# Patient Record
Sex: Male | Born: 1975 | Race: Black or African American | Hispanic: No | Marital: Single | Smoking: Never smoker
Health system: Southern US, Community
[De-identification: ages and names within clinical notes are randomized; demographics above are authoritative.]

## PROBLEM LIST (undated history)

## (undated) HISTORY — PX: VASECTOMY: SHX75

## (undated) HISTORY — PX: OTHER SURGICAL HISTORY: SHX169

---

## 2012-09-26 ENCOUNTER — Ambulatory Visit (INDEPENDENT_AMBULATORY_CARE_PROVIDER_SITE_OTHER): Payer: BC Managed Care – PPO | Admitting: Family Medicine

## 2012-09-26 ENCOUNTER — Encounter: Payer: Self-pay | Admitting: Family Medicine

## 2012-09-26 VITALS — BP 131/80 | HR 76 | Temp 97.7°F | Ht 74.0 in | Wt 196.0 lb

## 2012-09-26 DIAGNOSIS — Z0000 Encounter for general adult medical examination without abnormal findings: Secondary | ICD-10-CM

## 2012-09-26 NOTE — Patient Instructions (Signed)
Testicular Problems and Self-Exam   Men can examine themselves easily and effectively with positive results. Monthly exams detect problems early and save lives. There are numerous causes of swelling in the testicle. Testicular cancer usually appears as a firm painless lump in the front part of the testicle. This may feel like a dull ache or heavy feeling located in the lower abdomen (belly), groin, or scrotum.   The risk is greater in men with undescended testicles and it is more common in young men. It is responsible for almost a fifth of cancers in males between ages 15 and 34. Other common causes of swellings, lumps, and testicular pain include injuries, inflammation (soreness) from infection, hydrocele, and torsion. These are a few of the reasons to do monthly self-examination of the testicles. The exam only takes minutes and could add years to your life. Get in the habit!   SELF-EXAMINATION OF THE TESTICLES   The testicles are easiest to examine after warm baths or showers and are more difficult to examine when you are cold. This is because the muscles attached to the testicles retract and pull them up higher or into the abdomen. While standing, roll one testicle between the thumb and forefinger. Feel for lumps, swelling, or discomfort. A normal testicle is egg shaped and feels firm. It is smooth and not tender. The spermatic cord can be felt as a firm spaghetti-like cord at the back of the testicle. It is also important to examine your groins. This is the crease between the front of your leg and your abdomen. Also, feel for enlarged lymph nodes (glands). Enlarged nodes are also a cause for you to see your caregiver for evaluation.   Self-examination of the testicles and groin areas on a regular basis will help you to know what your own testicles and groins feel like. This will help you pick up an abnormality (difference) at an earlier stage. Early discovery is the key to curing this cancer or treating other  conditions. Any lump, change, or swelling in the testicle calls for immediate evaluation by your caregiver. Cancer of the testicle does not result in impotence and it does not prevent normal intercourse or prevent having children. If your caregiver feels that medical treatment or chemotherapy could lead to infertility, sperm can be frozen for future use. It is necessary to see a caregiver as soon as possible after the discovery of a lump in a testicle.   Document Released: 05/29/2000 Document Revised: 05/15/2011 Document Reviewed: 02/22/2008   Exit[employer]® Patient Information ©2014 [employer].

## 2012-09-26 NOTE — Progress Notes (Signed)
  Subjective:    Patient ID: Bobby Contreras, male    DOB: [birth year], 37 y.o.   MRN: [id number]  HPI This 37 y.o. male presents for evaluation of wellness physical.  He denies any  Acute medical problems.  He has had labs drawn at his work.   Review of Systems No chest pain, SOB, HA, dizziness, vision change, N/V, diarrhea, constipation, dysuria, urinary urgency or frequency, myalgias, arthralgias or rash.     Objective:   Physical Exam Vital signs noted  Well developed well nourished male.  HEENT - Head atraumatic Normocephalic                Eyes - PERRLA, Conjuctiva - clear Sclera- Clear EOMI                Ears - EAC's Wnl TM's Wnl Gross Hearing WNL                Nose - Nares patent                 Throat - oropharanx wnl Respiratory - Lungs CTA bilateral Cardiac - RRR S1 and S2 without murmur GI - Abdomen soft Nontender and bowel sounds active x 4 GU - Normal testicular exam and male phalus, circumcised, No inguinal hernia. Extremities - No edema. Neuro - Grossly intact.        Assessment & Plan:  Routine general medical examination at a health care facility Declined labs since he has had them at work and encouraged him to drop off copy so I can review and have for his Chart.  Follow up in one year and encouraged him to do monthly testicular exams.

## 2013-09-23 ENCOUNTER — Telehealth: Payer: Self-pay | Admitting: Family Medicine

## 2013-12-01 NOTE — Telephone Encounter (Signed)
Wellness physical was not scheduled through his employer.

## 2015-10-01 DIAGNOSIS — Z13220 Encounter for screening for lipoid disorders: Secondary | ICD-10-CM | POA: Diagnosis not present

## 2015-10-01 DIAGNOSIS — Z131 Encounter for screening for diabetes mellitus: Secondary | ICD-10-CM | POA: Diagnosis not present

## 2015-10-01 DIAGNOSIS — Z0000 Encounter for general adult medical examination without abnormal findings: Secondary | ICD-10-CM | POA: Diagnosis not present

## 2016-07-01 ENCOUNTER — Emergency Department (HOSPITAL_COMMUNITY)
Admission: EM | Admit: 2016-07-01 | Discharge: 2016-07-01 | Disposition: A | Discharge status: Home / Self Care | Payer: BLUE CROSS/BLUE SHIELD | Attending: Emergency Medicine | Admitting: Emergency Medicine

## 2016-07-01 ENCOUNTER — Emergency Department (HOSPITAL_COMMUNITY): Payer: BLUE CROSS/BLUE SHIELD

## 2016-07-01 ENCOUNTER — Encounter (HOSPITAL_COMMUNITY): Payer: Self-pay | Admitting: Emergency Medicine

## 2016-07-01 DIAGNOSIS — M7989 Other specified soft tissue disorders: Secondary | ICD-10-CM | POA: Insufficient documentation

## 2016-07-01 DIAGNOSIS — Y999 Unspecified external cause status: Secondary | ICD-10-CM | POA: Insufficient documentation

## 2016-07-01 DIAGNOSIS — M25522 Pain in left elbow: Secondary | ICD-10-CM | POA: Diagnosis not present

## 2016-07-01 DIAGNOSIS — Y9355 Activity, bike riding: Secondary | ICD-10-CM | POA: Insufficient documentation

## 2016-07-01 DIAGNOSIS — S79912A Unspecified injury of left hip, initial encounter: Secondary | ICD-10-CM | POA: Diagnosis not present

## 2016-07-01 DIAGNOSIS — Y92410 Unspecified street and highway as the place of occurrence of the external cause: Secondary | ICD-10-CM | POA: Insufficient documentation

## 2016-07-01 DIAGNOSIS — M25552 Pain in left hip: Secondary | ICD-10-CM | POA: Diagnosis not present

## 2016-07-01 DIAGNOSIS — S59902A Unspecified injury of left elbow, initial encounter: Secondary | ICD-10-CM | POA: Diagnosis present

## 2016-07-01 DIAGNOSIS — S99922A Unspecified injury of left foot, initial encounter: Secondary | ICD-10-CM | POA: Diagnosis not present

## 2016-07-01 DIAGNOSIS — S32432A Displaced fracture of anterior column [iliopubic] of left acetabulum, initial encounter for closed fracture: Secondary | ICD-10-CM | POA: Diagnosis not present

## 2016-07-01 DIAGNOSIS — S51012A Laceration without foreign body of left elbow, initial encounter: Secondary | ICD-10-CM | POA: Diagnosis not present

## 2016-07-01 DIAGNOSIS — S41112A Laceration without foreign body of left upper arm, initial encounter: Secondary | ICD-10-CM | POA: Diagnosis not present

## 2016-07-01 LAB — I-STAT CHEM 8, ED
BUN: 16 mg/dL (ref 6–20)
CALCIUM ION: 1.14 mmol/L — AB (ref 1.15–1.40)
CHLORIDE: 107 mmol/L (ref 101–111)
Creatinine, Ser: 1.2 mg/dL (ref 0.61–1.24)
GLUCOSE: 125 mg/dL — AB (ref 65–99)
HCT: 48 % (ref 39.0–52.0)
Hemoglobin: 16.3 g/dL (ref 13.0–17.0)
Potassium: 3.5 mmol/L (ref 3.5–5.1)
Sodium: 141 mmol/L (ref 135–145)
TCO2: 24 mmol/L (ref 0–100)

## 2016-07-01 MED ORDER — HYDROCODONE-ACETAMINOPHEN 5-325 MG PO TABS: 1.0000 | ORAL_TABLET | Freq: Four times a day (QID) | ORAL | 0 refills | Status: DC | PRN

## 2016-07-01 MED ORDER — ONDANSETRON HCL 4 MG/2ML IJ SOLN
4.0000 mg | Freq: Once | INTRAMUSCULAR | Status: AC
  Administered 2016-07-01: 4 mg via INTRAVENOUS
  Filled 2016-07-01: qty 2

## 2016-07-01 MED ORDER — LIDOCAINE-EPINEPHRINE (PF) 2 %-1:200000 IJ SOLN
20.0000 mL | Freq: Once | INTRAMUSCULAR | Status: AC
  Administered 2016-07-01: 20 mL via INTRADERMAL
  Filled 2016-07-01: qty 20

## 2016-07-01 MED ORDER — HYDROMORPHONE HCL 1 MG/ML IJ SOLN
1.0000 mg | Freq: Once | INTRAMUSCULAR | Status: AC
  Administered 2016-07-01: 1 mg via INTRAVENOUS
  Filled 2016-07-01: qty 1

## 2016-07-01 MED ORDER — CEPHALEXIN 500 MG PO CAPS: 500.0000 mg | ORAL_CAPSULE | Freq: Two times a day (BID) | ORAL | 0 refills | Status: DC

## 2016-07-01 MED ORDER — TETANUS-DIPHTH-ACELL PERTUSSIS 5-2.5-18.5 LF-MCG/0.5 IM SUSP
0.5000 mL | Freq: Once | INTRAMUSCULAR | Status: AC
  Administered 2016-07-01: 0.5 mL via INTRAMUSCULAR
  Filled 2016-07-01: qty 0.5

## 2016-07-01 NOTE — ED Provider Notes (Signed)
LACERATION REPAIR Performed by: Renita Papa Authorized by: Renita Papa Consent: Verbal consent obtained. Risks and benefits: risks, benefits and alternatives were discussed Consent given by: patient Patient identity confirmed: provided demographic data Prepped and Draped in normal sterile fashion Wound explored, areolar fascia violated, no nerve or tendon damage, no foreign body  Laceration Location: posterior left upper arm proximal to the elbow  Laceration Length: 2 cm  No Foreign Bodies seen or palpated  Anesthesia: local infiltration  Local anesthetic: lidocaine 2% 1:200000 epinephrine  Anesthetic total: 5 ml  Irrigation method: syringe Amount of cleaning: extensive, saline and betadine  Skin closure: simple  Number of sutures: 5  Technique: simple interrupted   Patient tolerance: Patient tolerated the procedure well with no immediate complications.    Renita Papa, PA-C 07/01/16 1847    Milton Ferguson, MD 07/03/16 1300
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
## 2016-07-01 NOTE — ED Triage Notes (Signed)
Pt sent from [facility] at 1000 today related to left leg pain and left arm laceration at elbow. Pt has notable swelling to left foot.

## 2016-07-01 NOTE — Discharge Instructions (Signed)
Usual crutches do not put any weight on your left leg. Follow-up with Dr. Lance Sell or one of his partners in 1-2 weeks. Get sutures taken out of the arm in 2 weeks. Clean laceration twice a day with soap and water

## 2016-07-07 DIAGNOSIS — S329XXA Fracture of unspecified parts of lumbosacral spine and pelvis, initial encounter for closed fracture: Secondary | ICD-10-CM | POA: Diagnosis not present

## 2016-07-07 DIAGNOSIS — L089 Local infection of the skin and subcutaneous tissue, unspecified: Secondary | ICD-10-CM | POA: Diagnosis not present

## 2016-07-13 DIAGNOSIS — M25552 Pain in left hip: Secondary | ICD-10-CM | POA: Diagnosis not present

## 2016-07-27 DIAGNOSIS — S32435D Nondisplaced fracture of anterior column [iliopubic] of left acetabulum, subsequent encounter for fracture with routine healing: Secondary | ICD-10-CM | POA: Diagnosis not present

## 2016-08-24 DIAGNOSIS — S32435D Nondisplaced fracture of anterior column [iliopubic] of left acetabulum, subsequent encounter for fracture with routine healing: Secondary | ICD-10-CM | POA: Diagnosis not present

## 2016-10-03 DIAGNOSIS — Z131 Encounter for screening for diabetes mellitus: Secondary | ICD-10-CM | POA: Diagnosis not present

## 2016-10-03 DIAGNOSIS — Z1329 Encounter for screening for other suspected endocrine disorder: Secondary | ICD-10-CM | POA: Diagnosis not present

## 2016-10-03 DIAGNOSIS — Z0000 Encounter for general adult medical examination without abnormal findings: Secondary | ICD-10-CM | POA: Diagnosis not present

## 2016-10-03 DIAGNOSIS — Z136 Encounter for screening for cardiovascular disorders: Secondary | ICD-10-CM | POA: Diagnosis not present

## 2016-10-24 DIAGNOSIS — S32435D Nondisplaced fracture of anterior column [iliopubic] of left acetabulum, subsequent encounter for fracture with routine healing: Secondary | ICD-10-CM | POA: Diagnosis not present

## 2016-11-08 DIAGNOSIS — Z6826 Body mass index (BMI) 26.0-26.9, adult: Secondary | ICD-10-CM | POA: Diagnosis not present

## 2016-11-08 DIAGNOSIS — J069 Acute upper respiratory infection, unspecified: Secondary | ICD-10-CM | POA: Diagnosis not present

## 2016-11-30 ENCOUNTER — Ambulatory Visit (INDEPENDENT_AMBULATORY_CARE_PROVIDER_SITE_OTHER): Payer: Worker's Compensation | Admitting: Family

## 2016-11-30 ENCOUNTER — Encounter: Payer: Self-pay | Admitting: Family

## 2016-11-30 VITALS — BP 125/87 | HR 86 | Ht 74.0 in | Wt 216.0 lb

## 2016-11-30 DIAGNOSIS — Z4802 Encounter for removal of sutures: Secondary | ICD-10-CM

## 2016-11-30 DIAGNOSIS — L089 Local infection of the skin and subcutaneous tissue, unspecified: Secondary | ICD-10-CM | POA: Diagnosis not present

## 2016-11-30 DIAGNOSIS — T148XXA Other injury of unspecified body region, initial encounter: Secondary | ICD-10-CM

## 2016-11-30 DIAGNOSIS — S60922A Unspecified superficial injury of left hand, initial encounter: Secondary | ICD-10-CM | POA: Diagnosis not present

## 2016-11-30 DIAGNOSIS — Z5189 Encounter for other specified aftercare: Secondary | ICD-10-CM

## 2016-11-30 MED ORDER — CEPHALEXIN 500 MG PO CAPS: 500.0000 mg | ORAL_CAPSULE | Freq: Two times a day (BID) | ORAL | 0 refills | Status: DC

## 2016-11-30 MED ORDER — CEPHALEXIN 500 MG PO CAPS: 500.0000 mg | ORAL_CAPSULE | Freq: Two times a day (BID) | ORAL | 0 refills | Status: AC

## 2016-11-30 NOTE — Progress Notes (Signed)
   Subjective:    Patient ID: Bobby Contreras, male    DOB: Dec 07, 1975, 41 y.o.   MRN: [id number]  Pt presents to the office today for suture removal for a [employer], [employer]. PT cut his left hand while working on 11/19/16. PT went to the ED on had 7 sutures applied.  Suture / Staple Removal  The sutures were placed 11 to 14 days ago. He tried antibiotic ointment use since the wound repair. The treatment provided mild relief. His temperature was unmeasured prior to arrival. There has been no drainage from the wound. The redness has not changed. The swelling has not changed. The pain has not changed (7 out 10). He has no difficulty moving the affected extremity or digit.        Review of Systems  All other systems reviewed and are negative.      Objective:   Physical Exam  Constitutional: He is oriented to person, place, and time. He appears well-developed and well-nourished. No distress.  HENT:  Head: Normocephalic.  Cardiovascular: Normal rate, regular rhythm, normal heart sounds and intact distal pulses.   No murmur heard. Pulmonary/Chest: Effort normal and breath sounds normal. No respiratory distress. He has no wheezes.  Abdominal: Soft. Bowel sounds are normal. He exhibits no distension. There is no tenderness.  Musculoskeletal: Normal range of motion. He exhibits no edema or tenderness.  Neurological: He is alert and oriented to person, place, and time.  Skin: Skin is warm and dry. No rash noted. No erythema.  approx 4 cm Laceration on left flexor hand, erythemas and tenderness present  Psychiatric: He has a normal mood and affect. His behavior is normal. Judgment and thought content normal.  Vitals reviewed.     BP 125/87   Pulse 86   Ht 6' 2"  (1.88 m)   Wt 216 lb (98 kg)   BMI 27.73 kg/m      Assessment & Plan:  1. Visit for wound check - cephALEXin (KEFLEX) 500 MG capsule; Take 1 capsule (500 mg total) by mouth 2 (two) times daily.  Dispense: 14  capsule; Refill: 0  2. Visit for suture removal - cephALEXin (KEFLEX) 500 MG capsule; Take 1 capsule (500 mg total) by mouth 2 (two) times daily.  Dispense: 14 capsule; Refill: 0  3. Wound infection - cephALEXin (KEFLEX) 500 MG capsule; Take 1 capsule (500 mg total) by mouth 2 (two) times daily.  Dispense: 14 capsule; Refill: 0  Clean and dry Report any increase redness, swelling, tenderness, or discharge Light duty written for next two days and then return to regular duties at work RTO prn   Evelina Dun, FNP

## 2016-11-30 NOTE — Patient Instructions (Signed)
Suture Removal, Care After Refer to this sheet in the next few weeks. These instructions provide you with information on caring for yourself after your procedure. Your health care provider may also give you more specific instructions. Your treatment has been planned according to current medical practices, but problems sometimes occur. Call your health care provider if you have any problems or questions after your procedure. What can I expect after the procedure? After your stitches (sutures) are removed, it is typical to have the following:  Some discomfort and swelling in the wound area.  Slight redness in the area.  Follow these instructions at home:  If you have skin adhesive strips over the wound area, do not take the strips off. They will fall off on their own in a few days. If the strips remain in place after 14 days, you may remove them.  Change any bandages (dressings) at least once a day or as directed by your health care provider. If the bandage sticks, soak it off with warm, soapy water.  Apply cream or ointment only as directed by your health care provider. If using cream or ointment, wash the area with soap and water 2 times a day to remove all the cream or ointment. Rinse off the soap and pat the area dry with a clean towel.  Keep the wound area dry and clean. If the bandage becomes wet or dirty, or if it develops a bad smell, change it as soon as possible.  Continue to protect the wound from injury.  Use sunscreen when out in the sun. New scars become sunburned easily. Contact a health care provider if:  You have increasing redness, swelling, or pain in the wound.  You see pus coming from the wound.  You have a fever.  You notice a bad smell coming from the wound or dressing.  Your wound breaks open (edges not staying together). This information is not intended to replace advice given to you by your health care provider. Make sure you discuss any questions you have  with your health care provider. Document Released: 11/15/2000 Document Revised: 07/29/2015 Document Reviewed: 10/02/2012 Elsevier Interactive Patient Education  2017 [employer].

## 2016-12-04 DIAGNOSIS — H5213 Myopia, bilateral: Secondary | ICD-10-CM | POA: Diagnosis not present

## 2016-12-11 DIAGNOSIS — I10 Essential (primary) hypertension: Secondary | ICD-10-CM | POA: Diagnosis not present

## 2017-06-11 DIAGNOSIS — I10 Essential (primary) hypertension: Secondary | ICD-10-CM | POA: Diagnosis not present

## 2017-10-23 DIAGNOSIS — Z0000 Encounter for general adult medical examination without abnormal findings: Secondary | ICD-10-CM | POA: Diagnosis not present

## 2017-10-23 DIAGNOSIS — I10 Essential (primary) hypertension: Secondary | ICD-10-CM | POA: Diagnosis not present
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
## 2018-02-21 DIAGNOSIS — H10013 Acute follicular conjunctivitis, bilateral: Secondary | ICD-10-CM | POA: Diagnosis not present

## 2018-10-25 DIAGNOSIS — I10 Essential (primary) hypertension: Secondary | ICD-10-CM | POA: Diagnosis not present

## 2019-05-05 DIAGNOSIS — Z131 Encounter for screening for diabetes mellitus: Secondary | ICD-10-CM | POA: Diagnosis not present

## 2019-05-05 DIAGNOSIS — Z0000 Encounter for general adult medical examination without abnormal findings: Secondary | ICD-10-CM | POA: Diagnosis not present

## 2019-05-05 DIAGNOSIS — Z13220 Encounter for screening for lipoid disorders: Secondary | ICD-10-CM | POA: Diagnosis not present

## 2019-05-05 DIAGNOSIS — Z125 Encounter for screening for malignant neoplasm of prostate: Secondary | ICD-10-CM | POA: Diagnosis not present

## 2019-10-27 DIAGNOSIS — I10 Essential (primary) hypertension: Secondary | ICD-10-CM | POA: Diagnosis not present

## 2020-04-15 DIAGNOSIS — I10 Essential (primary) hypertension: Secondary | ICD-10-CM | POA: Diagnosis not present

## 2020-04-15 DIAGNOSIS — M25511 Pain in right shoulder: Secondary | ICD-10-CM | POA: Diagnosis not present

## 2020-04-19 DIAGNOSIS — M7581 Other shoulder lesions, right shoulder: Secondary | ICD-10-CM | POA: Diagnosis not present

## 2020-05-17 DIAGNOSIS — M25511 Pain in right shoulder: Secondary | ICD-10-CM | POA: Diagnosis not present

## 2020-05-19 DIAGNOSIS — Z0189 Encounter for other specified special examinations: Secondary | ICD-10-CM | POA: Diagnosis not present

## 2020-05-19 DIAGNOSIS — E559 Vitamin D deficiency, unspecified: Secondary | ICD-10-CM | POA: Diagnosis not present

## 2020-05-19 DIAGNOSIS — Z13220 Encounter for screening for lipoid disorders: Secondary | ICD-10-CM | POA: Diagnosis not present

## 2020-05-19 DIAGNOSIS — Z131 Encounter for screening for diabetes mellitus: Secondary | ICD-10-CM | POA: Diagnosis not present

## 2020-06-08 DIAGNOSIS — M25511 Pain in right shoulder: Secondary | ICD-10-CM | POA: Diagnosis not present

## 2020-06-21 DIAGNOSIS — M25511 Pain in right shoulder: Secondary | ICD-10-CM | POA: Diagnosis not present

## 2020-08-03 DIAGNOSIS — M25511 Pain in right shoulder: Secondary | ICD-10-CM | POA: Diagnosis not present

## 2021-05-23 DIAGNOSIS — Z13220 Encounter for screening for lipoid disorders: Secondary | ICD-10-CM | POA: Diagnosis not present

## 2021-05-23 DIAGNOSIS — Z0000 Encounter for general adult medical examination without abnormal findings: Secondary | ICD-10-CM | POA: Diagnosis not present

## 2021-06-07 DIAGNOSIS — Z1212 Encounter for screening for malignant neoplasm of rectum: Secondary | ICD-10-CM | POA: Diagnosis not present

## 2021-06-07 DIAGNOSIS — Z1211 Encounter for screening for malignant neoplasm of colon: Secondary | ICD-10-CM | POA: Diagnosis not present

## 2021-06-23 DIAGNOSIS — M545 Low back pain, unspecified: Secondary | ICD-10-CM | POA: Diagnosis not present

## 2022-06-12 DIAGNOSIS — Z125 Encounter for screening for malignant neoplasm of prostate: Secondary | ICD-10-CM | POA: Diagnosis not present

## 2022-06-12 DIAGNOSIS — I10 Essential (primary) hypertension: Secondary | ICD-10-CM | POA: Diagnosis not present

## 2022-06-12 DIAGNOSIS — Z0000 Encounter for general adult medical examination without abnormal findings: Secondary | ICD-10-CM | POA: Diagnosis not present

## 2023-06-25 DIAGNOSIS — R41840 Attention and concentration deficit: Secondary | ICD-10-CM | POA: Diagnosis not present

## 2023-06-25 DIAGNOSIS — Z0000 Encounter for general adult medical examination without abnormal findings: Secondary | ICD-10-CM | POA: Diagnosis not present

## 2023-06-25 DIAGNOSIS — I10 Essential (primary) hypertension: Secondary | ICD-10-CM | POA: Diagnosis not present

## 2023-11-29 DIAGNOSIS — M545 Low back pain, unspecified: Secondary | ICD-10-CM | POA: Diagnosis not present

## 2023-11-29 DIAGNOSIS — R030 Elevated blood-pressure reading, without diagnosis of hypertension: Secondary | ICD-10-CM | POA: Diagnosis not present
